# Patient Record
Sex: Female | Born: 1950 | Race: White | Hispanic: No | Marital: Married | Smoking: Never smoker
Health system: Southern US, Community
[De-identification: ages and names within clinical notes are randomized; demographics above are authoritative.]

## PROBLEM LIST (undated history)

## (undated) DIAGNOSIS — C73 Malignant neoplasm of thyroid gland: Secondary | ICD-10-CM

## (undated) HISTORY — PX: THYROIDECTOMY: SHX17

---

## 2009-12-01 ENCOUNTER — Emergency Department (HOSPITAL_COMMUNITY): Admission: EM | Admit: 2009-12-01 | Discharge: 2009-12-01 | Payer: Self-pay | Admitting: Emergency Medicine

## 2010-10-03 LAB — POCT URINALYSIS DIP (DEVICE)
Glucose, UA: 250 mg/dL — AB
Nitrite: POSITIVE — AB

## 2010-10-03 LAB — POCT I-STAT, CHEM 8
Chloride: 101 mEq/L (ref 96–112)
Glucose, Bld: 88 mg/dL (ref 70–99)
HCT: 43 % (ref 36.0–46.0)
Potassium: 3.6 mEq/L (ref 3.5–5.1)

## 2011-06-02 ENCOUNTER — Emergency Department (INDEPENDENT_AMBULATORY_CARE_PROVIDER_SITE_OTHER)
Admission: EM | Admit: 2011-06-02 | Discharge: 2011-06-02 | Disposition: A | Discharge status: Home / Self Care | Payer: BC Managed Care – PPO | Source: Home / Self Care | Attending: Family Medicine | Admitting: Family Medicine

## 2011-06-02 ENCOUNTER — Encounter: Payer: Self-pay | Admitting: *Deleted

## 2011-06-02 ENCOUNTER — Emergency Department (INDEPENDENT_AMBULATORY_CARE_PROVIDER_SITE_OTHER): Payer: BC Managed Care – PPO

## 2011-06-02 DIAGNOSIS — IMO0002 Reserved for concepts with insufficient information to code with codable children: Secondary | ICD-10-CM

## 2011-06-02 DIAGNOSIS — S6440XA Injury of digital nerve of unspecified finger, initial encounter: Secondary | ICD-10-CM

## 2011-06-02 HISTORY — DX: Malignant neoplasm of thyroid gland: C73

## 2011-06-02 MED ORDER — TETANUS-DIPHTH-ACELL PERTUSSIS 5-2.5-18.5 LF-MCG/0.5 IM SUSP
0.5000 mL | Freq: Once | INTRAMUSCULAR | Status: AC
  Administered 2011-06-02: 0.5 mL via INTRAMUSCULAR

## 2011-06-02 MED ORDER — TETANUS-DIPHTH-ACELL PERTUSSIS 5-2.5-18.5 LF-MCG/0.5 IM SUSP
INTRAMUSCULAR | Status: AC
  Filled 2011-06-02: qty 0.5

## 2011-06-02 MED ORDER — CEPHALEXIN 500 MG PO CAPS: 500.0000 mg | ORAL_CAPSULE | Freq: Four times a day (QID) | ORAL | Status: AC

## 2011-06-02 NOTE — ED Notes (Signed)
Lac    To  l  Hand   While  Cutting  Food  With a  Knife      Bleeding     Has  Now     Subsided

## 2011-06-02 NOTE — ED Notes (Signed)
Pty  States  She  Has  Had  A  Tet   Shot  Within  5  Years

## 2011-06-02 NOTE — ED Provider Notes (Signed)
History     CSN: [id number] Arrival date & time: 06/02/2011  9:48 AM   First MD Initiated Contact with Patient 06/02/11 [phone]      Chief Complaint  Patient presents with  . Laceration    (Consider location/radiation/quality/duration/timing/severity/associated sxs/prior treatment) Patient is a 60 y.o. female presenting with skin laceration. The history is provided by the patient.  Laceration  The incident occurred 1 to 2 hours ago (separating frozen biscuits with serrated knife). The laceration is located on the left hand. The laceration is 1 cm in size. The laceration mechanism was a a clean knife. The patient is experiencing no pain. Her tetanus status is out of date.    Past Medical History  Diagnosis Date  . Thyroid ca     Past Surgical History  Procedure Date  . Thyroidectomy     Family History  Problem Relation Age of Onset  . Stroke Mother   . Hypertension Father   . Thyroid disease Father   . Cancer Father     History  Substance Use Topics  . Smoking status: Never Smoker   . Smokeless tobacco: Not on file  . Alcohol Use: No    OB History    Grav Para Term Preterm Abortions TAB SAB Ect Mult Living                  Review of Systems  Constitutional: Negative.   Musculoskeletal: Negative.   Skin: Positive for wound.  Neurological: Positive for numbness.    Allergies  Review of patient's allergies indicates no known allergies.  Home Medications   Current Outpatient Rx  Name Route Sig Dispense Refill  . LEVOTHYROXINE SODIUM 50 MCG PO TABS Oral Take 50 mcg by mouth daily.      . THYROID 120 MG PO TABS Oral Take 120 mg by mouth daily.        BP 121/78  Pulse 65  Temp(Src) 98.2 F (36.8 C) (Oral)  Resp 16  SpO2 100%  Physical Exam  Constitutional: She appears well-developed and well-nourished.  Musculoskeletal: Normal range of motion.       Arms: Skin: Skin is warm and dry.    ED Course  Procedures (including critical care  time)  Labs Reviewed - No data to display No results found.   No diagnosis found.    MDM  X-rays reviewed and report per radiologist.        Barkley Bruns, MD 06/03/11 [phone]

## 2012-11-10 IMAGING — CR DG HAND COMPLETE 3+V*L*
3 series · 3 of 3 positions shown · non-contrast
Comparison: None.

CLINICAL DATA: Puncture wound.

LEFT HAND - COMPLETE 3+ VIEW

[view not recorded (1 of 3)]
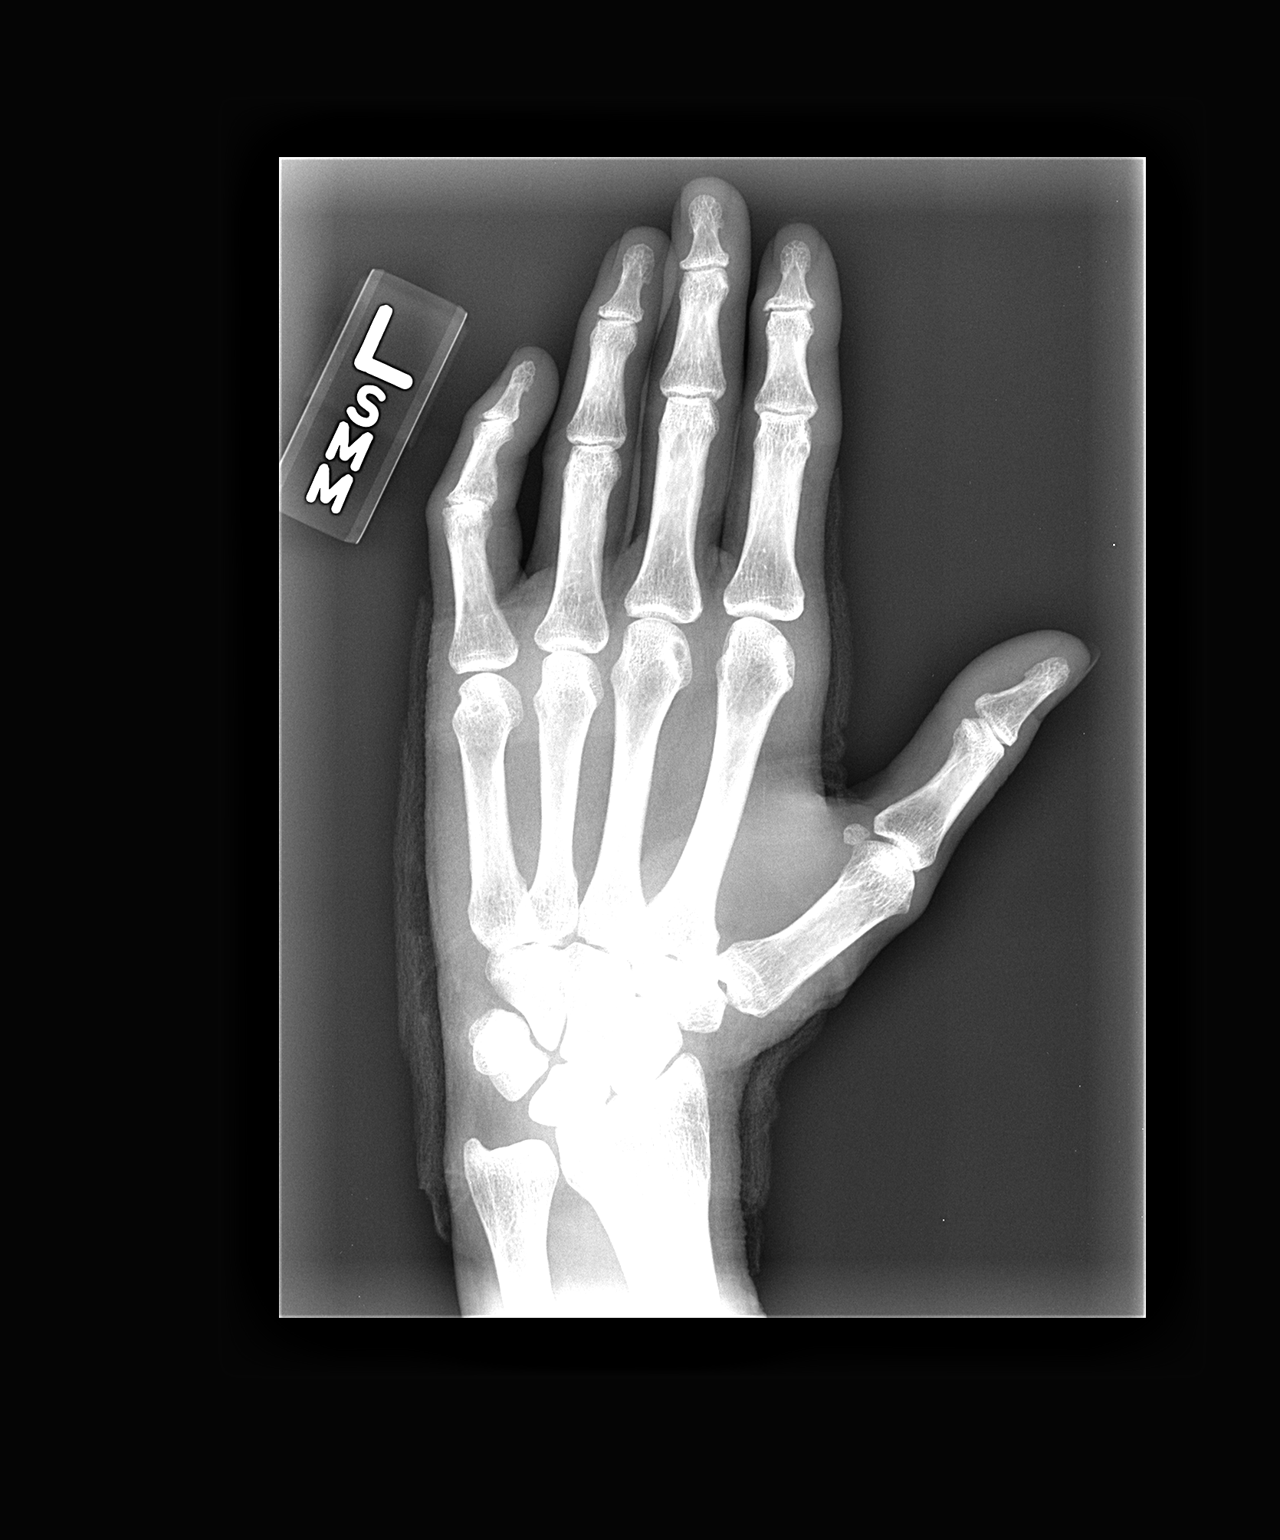

[view not recorded (2 of 3)]
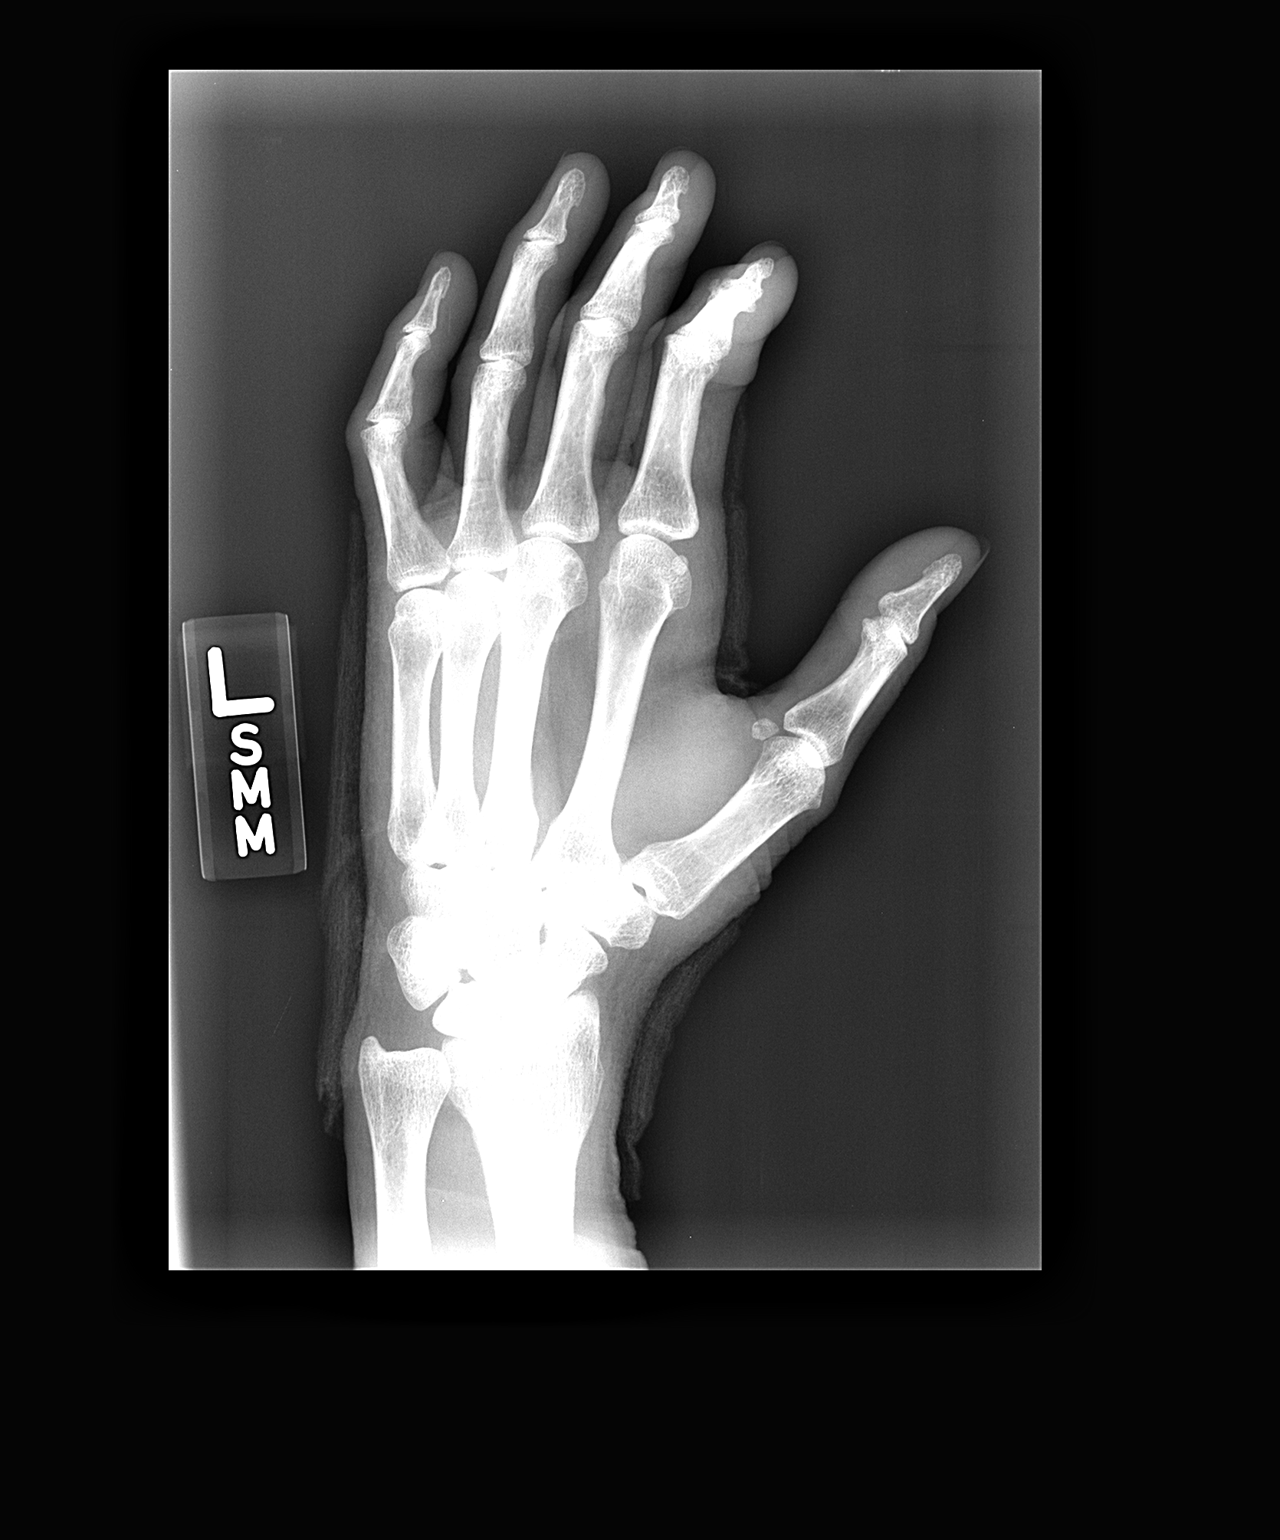

[view not recorded (3 of 3)]
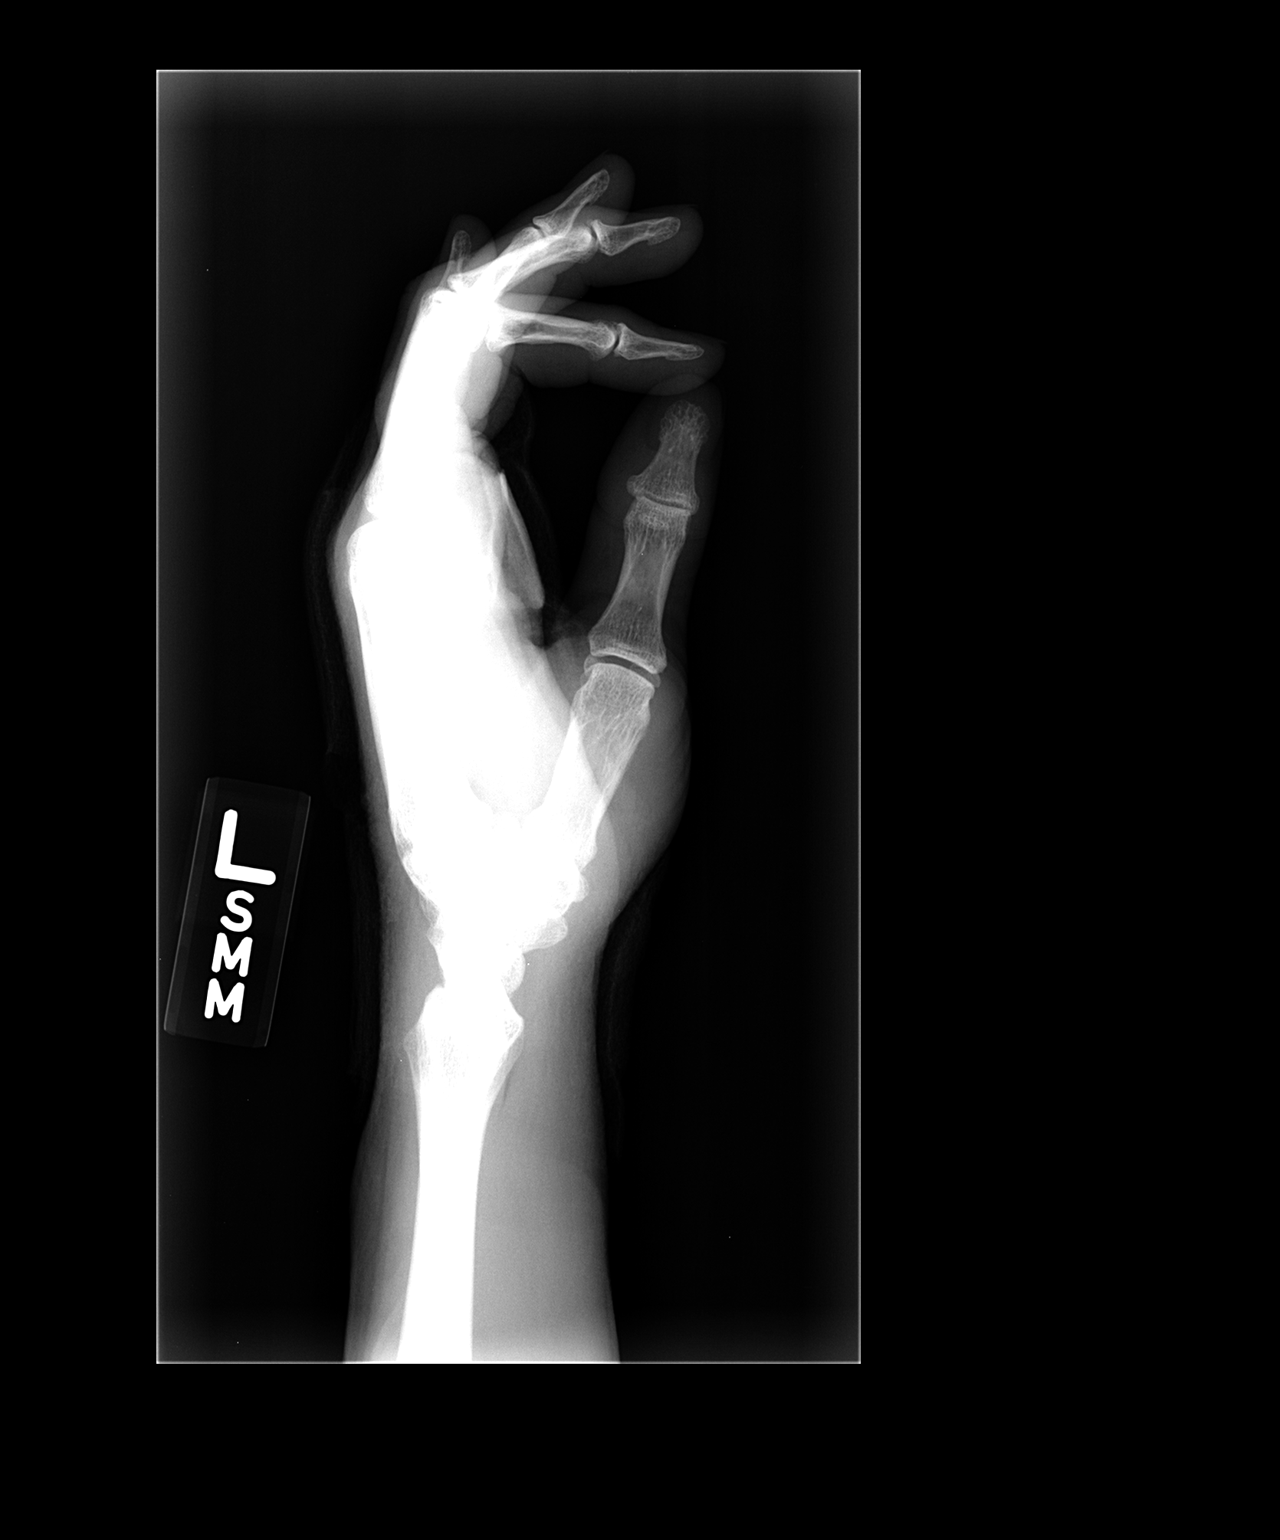

[3 of 3 positions shown; findings below may reference images not displayed]

FINDINGS: Bandaging is seen along the volar aspect of the hand on
the ulnar side.  No underlying fracture, dislocation or foreign
body.
IMPRESSION: Negative for fracture or foreign body.

## 2013-01-02 ENCOUNTER — Encounter: Payer: Self-pay | Admitting: Gastroenterology

## 2013-02-12 ENCOUNTER — Encounter: Payer: BC Managed Care – PPO | Admitting: Gastroenterology

## 2013-02-12 ENCOUNTER — Other Ambulatory Visit: Payer: BC Managed Care – PPO | Admitting: Gastroenterology

## 2013-03-03 ENCOUNTER — Ambulatory Visit (AMBULATORY_SURGERY_CENTER): Payer: BC Managed Care – PPO | Admitting: *Deleted

## 2013-03-03 VITALS — Ht 64.0 in | Wt 126.8 lb

## 2013-03-03 DIAGNOSIS — Z1211 Encounter for screening for malignant neoplasm of colon: Secondary | ICD-10-CM

## 2013-03-03 MED ORDER — MOVIPREP 100 G PO SOLR: ORAL | Status: AC

## 2013-03-03 NOTE — Progress Notes (Signed)
No allergies to eggs or soy. No problems with anesthesia.  

## 2013-03-04 ENCOUNTER — Encounter: Payer: Self-pay | Admitting: Gastroenterology

## 2013-03-11 ENCOUNTER — Other Ambulatory Visit: Payer: BC Managed Care – PPO | Admitting: Gastroenterology

## 2014-05-14 ENCOUNTER — Encounter: Payer: Self-pay | Admitting: Gastroenterology

## 2014-07-06 ENCOUNTER — Telehealth: Payer: Self-pay | Admitting: *Deleted

## 2014-07-06 NOTE — Telephone Encounter (Signed)
Pt did not call to reschedule previsit. Colonoscopy cancelled and no show letter mailed to patient.

## 2014-07-06 NOTE — Telephone Encounter (Signed)
Patient no show for previsit appointment 07/06/14. Phone call made to both mobile and home telephone numbers, number identifiers only, message left that appointment was missed and needs to be rescheduled today by 5 pm or colonoscopy scheduled 07/20/14 would also be cancelled.

## 2014-07-20 ENCOUNTER — Encounter: Payer: BC Managed Care – PPO | Admitting: Gastroenterology

## 2020-03-17 DIAGNOSIS — U071 COVID-19: Secondary | ICD-10-CM

## 2020-03-17 HISTORY — DX: COVID-19: U07.1

## 2020-04-15 ENCOUNTER — Other Ambulatory Visit: Payer: Self-pay

## 2020-04-15 ENCOUNTER — Emergency Department (HOSPITAL_COMMUNITY): Payer: 59

## 2020-04-15 ENCOUNTER — Encounter (HOSPITAL_COMMUNITY): Payer: Self-pay

## 2020-04-15 ENCOUNTER — Emergency Department (HOSPITAL_COMMUNITY)
Admission: EM | Admit: 2020-04-15 | Discharge: 2020-04-16 | Disposition: A | Discharge status: Home / Self Care | Payer: 59 | Attending: Emergency Medicine | Admitting: Emergency Medicine

## 2020-04-15 DIAGNOSIS — R519 Headache, unspecified: Secondary | ICD-10-CM | POA: Diagnosis present

## 2020-04-15 DIAGNOSIS — U071 COVID-19: Secondary | ICD-10-CM | POA: Diagnosis not present

## 2020-04-15 DIAGNOSIS — Z85850 Personal history of malignant neoplasm of thyroid: Secondary | ICD-10-CM | POA: Diagnosis not present

## 2020-04-15 DIAGNOSIS — R112 Nausea with vomiting, unspecified: Secondary | ICD-10-CM | POA: Diagnosis not present

## 2020-04-15 DIAGNOSIS — Z79890 Hormone replacement therapy (postmenopausal): Secondary | ICD-10-CM | POA: Insufficient documentation

## 2020-04-15 DIAGNOSIS — E039 Hypothyroidism, unspecified: Secondary | ICD-10-CM | POA: Insufficient documentation

## 2020-04-15 LAB — COMPREHENSIVE METABOLIC PANEL
ALT: 23 U/L (ref 0–44)
AST: 34 U/L (ref 15–41)
Albumin: 3.8 g/dL (ref 3.5–5.0)
Alkaline Phosphatase: 78 U/L (ref 38–126)
Anion gap: 11 (ref 5–15)
BUN: 12 mg/dL (ref 8–23)
CO2: 26 mmol/L (ref 22–32)
Calcium: 8.5 mg/dL — ABNORMAL LOW (ref 8.9–10.3)
Chloride: 97 mmol/L — ABNORMAL LOW (ref 98–111)
Creatinine, Ser: 0.7 mg/dL (ref 0.44–1.00)
GFR calc Af Amer: >60 mL/min (ref >60)
GFR calc non Af Amer: >60 mL/min (ref >60)
Glucose, Bld: 117 mg/dL — ABNORMAL HIGH (ref 70–99)
Potassium: 4.2 mmol/L (ref 3.5–5.1)
Sodium: 134 mmol/L — ABNORMAL LOW (ref 135–145)
Total Bilirubin: 0.3 mg/dL (ref 0.3–1.2)
Total Protein: 7.3 g/dL (ref 6.5–8.1)

## 2020-04-15 LAB — CBC
HCT: 46.2 % — ABNORMAL HIGH (ref 36.0–46.0)
Hemoglobin: 15.4 g/dL — ABNORMAL HIGH (ref 12.0–15.0)
MCH: 26.8 pg (ref 26.0–34.0)
MCHC: 33.3 g/dL (ref 30.0–36.0)
MCV: 80.5 fL (ref 80.0–100.0)
Platelets: 182 10*3/uL (ref 150–400)
RBC: 5.74 MIL/uL — ABNORMAL HIGH (ref 3.87–5.11)
RDW: 12.6 % (ref 11.5–15.5)
WBC: 4.5 10*3/uL (ref 4.0–10.5)
nRBC: 0 % (ref 0.0–0.2)

## 2020-04-15 MED ORDER — ACETAMINOPHEN 325 MG PO TABS
650.0000 mg | ORAL_TABLET | Freq: Once | ORAL | Status: AC
  Administered 2020-04-16: 650 mg via ORAL
  Filled 2020-04-15: qty 2

## 2020-04-15 MED ORDER — SODIUM CHLORIDE 0.9 % IV BOLUS
500.0000 mL | Freq: Once | INTRAVENOUS | Status: AC
  Administered 2020-04-16: 500 mL via INTRAVENOUS

## 2020-04-15 NOTE — ED Triage Notes (Signed)
Pt was dx with covid on Monday. Pt stated that last night she began having N/V. Pt prescribed phenergan, states it took the edge off nausea but not completely gone. Pt worried that she may be dehydrated. PCP recommended that the pt goes to ED.

## 2020-04-15 NOTE — ED Provider Notes (Signed)
[facility] DEPT Provider Note   CSN: [id number] Arrival date & time: 04/15/20  1939     History Chief Complaint  Patient presents with  . Nausea    Gloria Diaz is a 69 y.o. female with known COVID-19 infection diagnosed on 04/12/2020 (at [facility]), which was her first day of symptoms.  She reports severe myalgias, headache, nausea and nonbloody nonbilious vomiting. She denies fever, chills,  but endorses sweats. She denies cough, shortness of breath, chest pain, sore throat, congestion, diarrhea.  She was seen by telemedicine visit with her new PCP yesterday, due to worsening nausea with multiple episodes of emesis.  This provider prescribed her colchicine, Tamiflu, fluvoxamine, and Phenergan.  Patient reports that she took the first dose of each of these medicines last night and shortly thereafter vomited them back up.  She has not taken any more medications, except for Phenergan.  Her most recent dose of Phenergan was at 5 PM; she states that her nausea is mild at this point.  She has not vomited today, but has had several episodes of dry heaving.  She says she has not had anything to eat or drink today.   I have personally reviewed the patient's medical record.  Medical history significant for hypothyroidism on multiple medications (she states she feels best when on all 3 thyroid medications), and chronic sinusitis which is followed by otolaryngology.  Patient does not have a primary care physician.  She recently finished a course of levofloxacin from her ENT, for sinusitis.    She has not been vaccinated against COVID-19.  Because she was concerned that it would "get inside and change parts of her body", and felt that she was healthy enough to withstand COVID-19 infection.  HPI     Past Medical History:  Diagnosis Date  . Thyroid ca [facility])     There are no problems to display for this patient.   Past Surgical History:  Procedure Laterality  Date  . THYROIDECTOMY       OB History   No obstetric history on file.     Family History  Problem Relation Age of Onset  . Stroke Mother   . Hypertension Father   . Thyroid disease Father   . Cancer Father   . Colon cancer Neg Hx     Social History   Tobacco Use  . Smoking status: Never Smoker  . Smokeless tobacco: Never Used  Substance Use Topics  . Alcohol use: Yes    Alcohol/week: 5.0 standard drinks    Types: 5 Glasses of wine per week  . Drug use: No    Home Medications Prior to Admission medications   Medication Sig Start Date End Date Taking? Authorizing Provider  levothyroxine (SYNTHROID, LEVOTHROID) 50 MCG tablet Take 50 mcg by mouth daily.      [provider]  liothyronine (CYTOMEL) 5 MCG tablet Take 5 mcg by mouth daily.    [provider]  MOVIPREP 100 G SOLR moviprep as directed. No substitutions 03/03/13   Milus Banister, MD  thyroid (ARMOUR) 120 MG tablet Take 120 mg by mouth daily.      [provider]    Allergies    Patient has no known allergies.  Review of Systems   Review of Systems  Constitutional: Positive for appetite change, chills, diaphoresis and fatigue. Negative for fever.  HENT: Negative.   Eyes: Negative.   Respiratory: Negative for cough, chest tightness and shortness of breath.  Cardiovascular: Negative for chest pain, palpitations and leg swelling.  Gastrointestinal: Positive for nausea and vomiting. Negative for abdominal pain and diarrhea.  Genitourinary: Negative for dysuria.  Musculoskeletal: Positive for myalgias. Negative for neck pain and neck stiffness.  Neurological: Positive for headaches. Negative for syncope, weakness and light-headedness.  Hematological: Positive for adenopathy.  Psychiatric/Behavioral: Negative.     Physical Exam Updated Vital Signs BP 111/75   Pulse 68   Temp 98.8 F (37.1 C) (Oral)   Resp 12   Ht 5\' 4"  (1.626 m)   Wt 57.6 kg   SpO2 96%   BMI 21.80 kg/m     Physical Exam Vitals and nursing note reviewed.  HENT:     Head: Normocephalic and atraumatic.     Nose:     Right Turbinates: Enlarged.     Left Turbinates: Enlarged.     Mouth/Throat:     Mouth: Mucous membranes are dry.     Pharynx: Oropharynx is clear. Uvula midline. No pharyngeal swelling, oropharyngeal exudate or posterior oropharyngeal erythema.  Eyes:     General:        Right eye: No discharge.        Left eye: No discharge.     Extraocular Movements: Extraocular movements intact.     Conjunctiva/sclera: Conjunctivae normal.     Pupils: Pupils are equal, round, and reactive to light.  Cardiovascular:     Rate and Rhythm: Normal rate and regular rhythm.     Pulses: Normal pulses.     Heart sounds: Normal heart sounds. No murmur heard.   Pulmonary:     Effort: Pulmonary effort is normal. No respiratory distress.     Breath sounds: Examination of the right-lower field reveals rhonchi. Rhonchi present. No wheezing.  Abdominal:     General: Bowel sounds are normal. There is no distension.     Palpations: Abdomen is soft.     Tenderness: There is no abdominal tenderness.  Musculoskeletal:        General: No tenderness or deformity.     Cervical back: Normal range of motion and neck supple. No pain with movement.     Right lower leg: No edema.     Left lower leg: No edema.  Lymphadenopathy:     Cervical: Cervical adenopathy present.     Right cervical: Superficial cervical adenopathy present.     Left cervical: Superficial cervical adenopathy present.  Skin:    General: Skin is warm and dry.     Capillary Refill: Capillary refill takes less than 2 seconds.  Neurological:     General: No focal deficit present.     Mental Status: She is alert and oriented to person, place, and time.  Psychiatric:        Mood and Affect: Mood normal.     ED Results / Procedures / Treatments   Labs (all labs ordered are listed, but only abnormal results are displayed) Labs  Reviewed  COMPREHENSIVE METABOLIC PANEL - Abnormal; Notable for the following components:      Result Value   Sodium 134 (*)    Chloride 97 (*)    Glucose, Bld 117 (*)    Calcium 8.5 (*)    All other components within normal limits  CBC - Abnormal; Notable for the following components:   RBC 5.74 (*)    Hemoglobin 15.4 (*)    HCT 46.2 (*)    All other components within normal limits    EKG None  Radiology DG  Chest Portable 1 View  Result Date: 04/15/2020 CLINICAL DATA:  [facility] COVID EXAM: PORTABLE CHEST 1 VIEW COMPARISON:  None. FINDINGS: No focal airspace disease or effusion. Borderline cardiomegaly. No pneumothorax. IMPRESSION: No active disease. Electronically Signed   By: Donavan Foil M.D.   On: 04/15/2020 23:54    Procedures Procedures (including critical care time)  Medications Ordered in ED Medications  sodium chloride 0.9 % bolus 500 mL (500 mLs Intravenous New Bag/Given 04/16/20 0010)  acetaminophen (TYLENOL) tablet 650 mg (650 mg Oral Given 04/16/20 0013)  promethazine (PHENERGAN) tablet 25 mg (25 mg Oral Given 04/16/20 0042)    ED Course  I have reviewed the triage vital signs and the nursing notes.  Pertinent labs & imaging results that were available during my care of the patient were reviewed by me and considered in my medical decision making (see chart for details).    MDM Rules/Calculators/A&P                         69 year old female with history of hypothyroidism diagnosed with COVID-19 on 04/12/2020.  Presents with worsening nausea, feeling dehydrated.   Vital signs normal on intake.  CBC hemoglobin 15.4.  CMP mild hyponatremia 134; normal kidney function.  Chest x-ray negative for acute cardiopulmonary disease.  Given borderline dry oral mucous membranes and borderline tachycardia of heart rate into 90s on physical exam, as well as patient feeling weak without oral intake today, will administer 500 cc normal saline bolus.  Antiemetic offered,  however patient declined stating she feels okay for now. Tylenol offered for myalgia.  Given patient's age of > 96 years old she does qualify for monoclonal antibody infusion outpatient.  I have messaged [facility], and communicated her case to them.  Patient now requesting nausea medication, promethazine ordered.  Given negative chest x-ray, stable vital signs, reassuring laboratory studies I do not feel any further work-up is necessary in the emergency department at this time.  Assuming patient's vital signs remain stable, she may be discharged after completion of her fluid bolus.  I have discussed the laboratory and imaging results with the patient.  I have instructed her to discontinue taking the colchicine, Tamiflu, fluvoxamine that were prescribed to her; there is insufficient scientific data to support there is usage for treatment of COVID-19 infection.  She may continue to take the promethazine as needed for nausea. I encouraged her to establish care with a primary care provider who can see her in person if necessary, as opposed to telemedicine only visits.   She voiced understanding of the treatment plan and each of her questions were answered to her expressed satisfaction.  She is amenable to discharge after completion of her fluids.  Strict return precautions were given at shift change.  Care of this patient was signed out to oncoming PA Quincy Carnes.  Gloria Diaz was evaluated in Emergency Department on 04/16/2020 for the symptoms described in the history of present illness. She was evaluated in the context of the global COVID-19 pandemic, which necessitated consideration that the patient might be at risk for infection with the SARS-CoV-2 virus that causes COVID-19. Institutional protocols and algorithms that pertain to the evaluation of patients at risk for COVID-19 are in a state of rapid change based on information released by regulatory bodies including the CDC and federal and state  organizations. These policies and algorithms were followed during the patient's care in the ED.  Final Clinical Impression(s) / ED Diagnoses Final  diagnoses:  [id number]    Rx / DC Orders ED Discharge Orders    None       Aura Dials 04/16/20 0054    Truddie Hidden, MD 04/16/20 [phone]

## 2020-04-15 NOTE — ED Notes (Signed)
1 blue top and 2 golds collected also

## 2020-04-16 ENCOUNTER — Other Ambulatory Visit: Payer: Self-pay | Admitting: Nurse Practitioner

## 2020-04-16 ENCOUNTER — Telehealth: Payer: Self-pay | Admitting: Nurse Practitioner

## 2020-04-16 ENCOUNTER — Encounter: Payer: Self-pay | Admitting: Nurse Practitioner

## 2020-04-16 DIAGNOSIS — U071 COVID-19: Secondary | ICD-10-CM

## 2020-04-16 MED ORDER — PROMETHAZINE HCL 25 MG PO TABS
25.0000 mg | ORAL_TABLET | Freq: Once | ORAL | Status: AC
  Administered 2020-04-16: 25 mg via ORAL
  Filled 2020-04-16: qty 1

## 2020-04-16 NOTE — Telephone Encounter (Signed)
I connected by phone with Gloria Diaz on 04/16/2020 at 2:00 PM to discuss the potential use of a new treatment for mild to moderate COVID-19 viral infection in non-hospitalized patients.  This patient is a 69 y.o. female that meets the FDA criteria for Emergency Use Authorization of COVID monoclonal antibody casirivimab/imdevimab.  Has a (+) direct SARS-CoV-2 viral test result  Has mild or moderate COVID-19   Is NOT hospitalized due to COVID-19  Is within 10 days of symptom onset  Has at least one of the high risk factor(s) for progression to severe COVID-19 and/or hospitalization as defined in EUA.  Specific high risk criteria : Older age (>/= 67 yo)   I have spoken and communicated the following to the patient or parent/caregiver regarding COVID monoclonal antibody treatment:  1. FDA has authorized the emergency use for the treatment of mild to moderate COVID-19 in adults and pediatric patients with positive results of direct SARS-CoV-2 viral testing who are 67 years of age and older weighing at least 40 kg, and who are at high risk for progressing to severe COVID-19 and/or hospitalization.  2. The significant known and potential risks and benefits of COVID monoclonal antibody, and the extent to which such potential risks and benefits are unknown.  3. Information on available alternative treatments and the risks and benefits of those alternatives, including clinical trials.  4. Patients treated with COVID monoclonal antibody should continue to self-isolate and use infection control measures (e.g., wear mask, isolate, social distance, avoid sharing personal items, clean and disinfect "high touch" surfaces, and frequent handwashing) according to CDC guidelines.   5. The patient or parent/caregiver has the option to accept or refuse COVID monoclonal antibody treatment.  After reviewing this information with the patient, the patient has agreed to receive one of the available covid 19  monoclonal antibodies and will be provided an appropriate fact sheet prior to infusion.  Murray Hodgkins, NP 04/16/2020 2:00 PM

## 2020-04-16 NOTE — Discharge Instructions (Addendum)
You have been seen in the emergency department today for further evaluation of your known COVID-19 infection.  You were administered IV fluids, Tylenol, and nausea medicine while in the emergency department.  Additionally you have qualified for monoclonal antibody therapy.  I have given your information to the [facility] monoclonal antibody team; you can expect a call from them sometime tomorrow. You may continue to utilize Tylenol and ibuprofen at home for management of your muscle pain.  Additionally you can continue to utilize the promethazine previously prescribed to you for nausea.  Please discontinue taking the colchicine, Tamiflu, fluvoxamine that were previously prescribed to you.  There is insufficient scientific data to support their usage for the treatment of COVID-19 infection.  Additionally I would recommend that you establish care with a primary care provider was able to see you in person if necessary, rather than televisit alone.  Please return to the emergency department immediately should you develop any new chest pain, shortness of breath, intractable nausea and vomiting, or other new and severe symptoms.

## 2020-04-16 NOTE — Progress Notes (Signed)
I connected by phone with Gloria Diaz on 04/16/2020 at 2:00 PM to discuss the potential use of a new treatment for mild to moderate COVID-19 viral infection in non-hospitalized patients.  This patient is a 69 y.o. female that meets the FDA criteria for Emergency Use Authorization of COVID monoclonal antibody casirivimab/imdevimab.  Has a (+) direct SARS-CoV-2 viral test result  Has mild or moderate COVID-19   Is NOT hospitalized due to COVID-19  Is within 10 days of symptom onset  Has at least one of the high risk factor(s) for progression to severe COVID-19 and/or hospitalization as defined in EUA.  Specific high risk criteria : Older age (>/= 68 yo)   I have spoken and communicated the following to the patient or parent/caregiver regarding COVID monoclonal antibody treatment:  1. FDA has authorized the emergency use for the treatment of mild to moderate COVID-19 in adults and pediatric patients with positive results of direct SARS-CoV-2 viral testing who are 47 years of age and older weighing at least 40 kg, and who are at high risk for progressing to severe COVID-19 and/or hospitalization.  2. The significant known and potential risks and benefits of COVID monoclonal antibody, and the extent to which such potential risks and benefits are unknown.  3. Information on available alternative treatments and the risks and benefits of those alternatives, including clinical trials.  4. Patients treated with COVID monoclonal antibody should continue to self-isolate and use infection control measures (e.g., wear mask, isolate, social distance, avoid sharing personal items, clean and disinfect "high touch" surfaces, and frequent handwashing) according to CDC guidelines.   5. The patient or parent/caregiver has the option to accept or refuse COVID monoclonal antibody treatment.  After reviewing this information with the patient, the patient has agreed to receive one of the available covid 19  monoclonal antibodies and will be provided an appropriate fact sheet prior to infusion.  Murray Hodgkins, NP 04/16/2020 2:00 PM

## 2020-04-17 ENCOUNTER — Ambulatory Visit (HOSPITAL_COMMUNITY)
Admission: RE | Admit: 2020-04-17 | Discharge: 2020-04-17 | Disposition: A | Discharge status: Home / Self Care | Payer: 59 | Source: Ambulatory Visit | Attending: Pulmonary Disease | Admitting: Pulmonary Disease

## 2020-04-17 DIAGNOSIS — U071 COVID-19: Secondary | ICD-10-CM | POA: Diagnosis present

## 2020-04-17 MED ORDER — SODIUM CHLORIDE 0.9 % IV SOLN
1200.0000 mg | Freq: Once | INTRAVENOUS | Status: AC
  Administered 2020-04-17: 1200 mg via INTRAVENOUS

## 2020-04-17 MED ORDER — DIPHENHYDRAMINE HCL 50 MG/ML IJ SOLN: 50.0000 mg | Freq: Once | INTRAMUSCULAR | Status: DC | PRN

## 2020-04-17 MED ORDER — EPINEPHRINE 0.3 MG/0.3ML IJ SOAJ: 0.3000 mg | Freq: Once | INTRAMUSCULAR | Status: DC | PRN

## 2020-04-17 MED ORDER — FAMOTIDINE IN NACL 20-0.9 MG/50ML-% IV SOLN: 20.0000 mg | Freq: Once | INTRAVENOUS | Status: DC | PRN

## 2020-04-17 MED ORDER — ALBUTEROL SULFATE HFA 108 (90 BASE) MCG/ACT IN AERS: 2.0000 | INHALATION_SPRAY | Freq: Once | RESPIRATORY_TRACT | Status: DC | PRN

## 2020-04-17 MED ORDER — METHYLPREDNISOLONE SODIUM SUCC 125 MG IJ SOLR: 125.0000 mg | Freq: Once | INTRAMUSCULAR | Status: DC | PRN

## 2020-04-17 MED ORDER — SODIUM CHLORIDE 0.9 % IV SOLN: INTRAVENOUS | Status: DC | PRN

## 2020-04-17 NOTE — Progress Notes (Signed)
  Diagnosis: COVID-19  Physician:  Procedure: Covid Infusion Clinic Med: casirivimab\imdevimab infusion - Provided patient with casirivimab\imdevimab fact sheet for patients, parents and caregivers prior to infusion.  Complications: No immediate complications noted.  Discharge: Discharged home   Carilyn Goodpasture 04/17/2020

## 2020-04-17 NOTE — Discharge Instructions (Signed)

## 2020-04-19 ENCOUNTER — Other Ambulatory Visit (HOSPITAL_COMMUNITY): Payer: Self-pay

## 2021-09-24 IMAGING — DX DG CHEST 1V PORT
1 series · 1 of 1 positions shown · non-contrast
Comparison: None.

CLINICAL DATA: Rhonchi COVID

EXAM:
PORTABLE CHEST 1 VIEW

[chest ap]
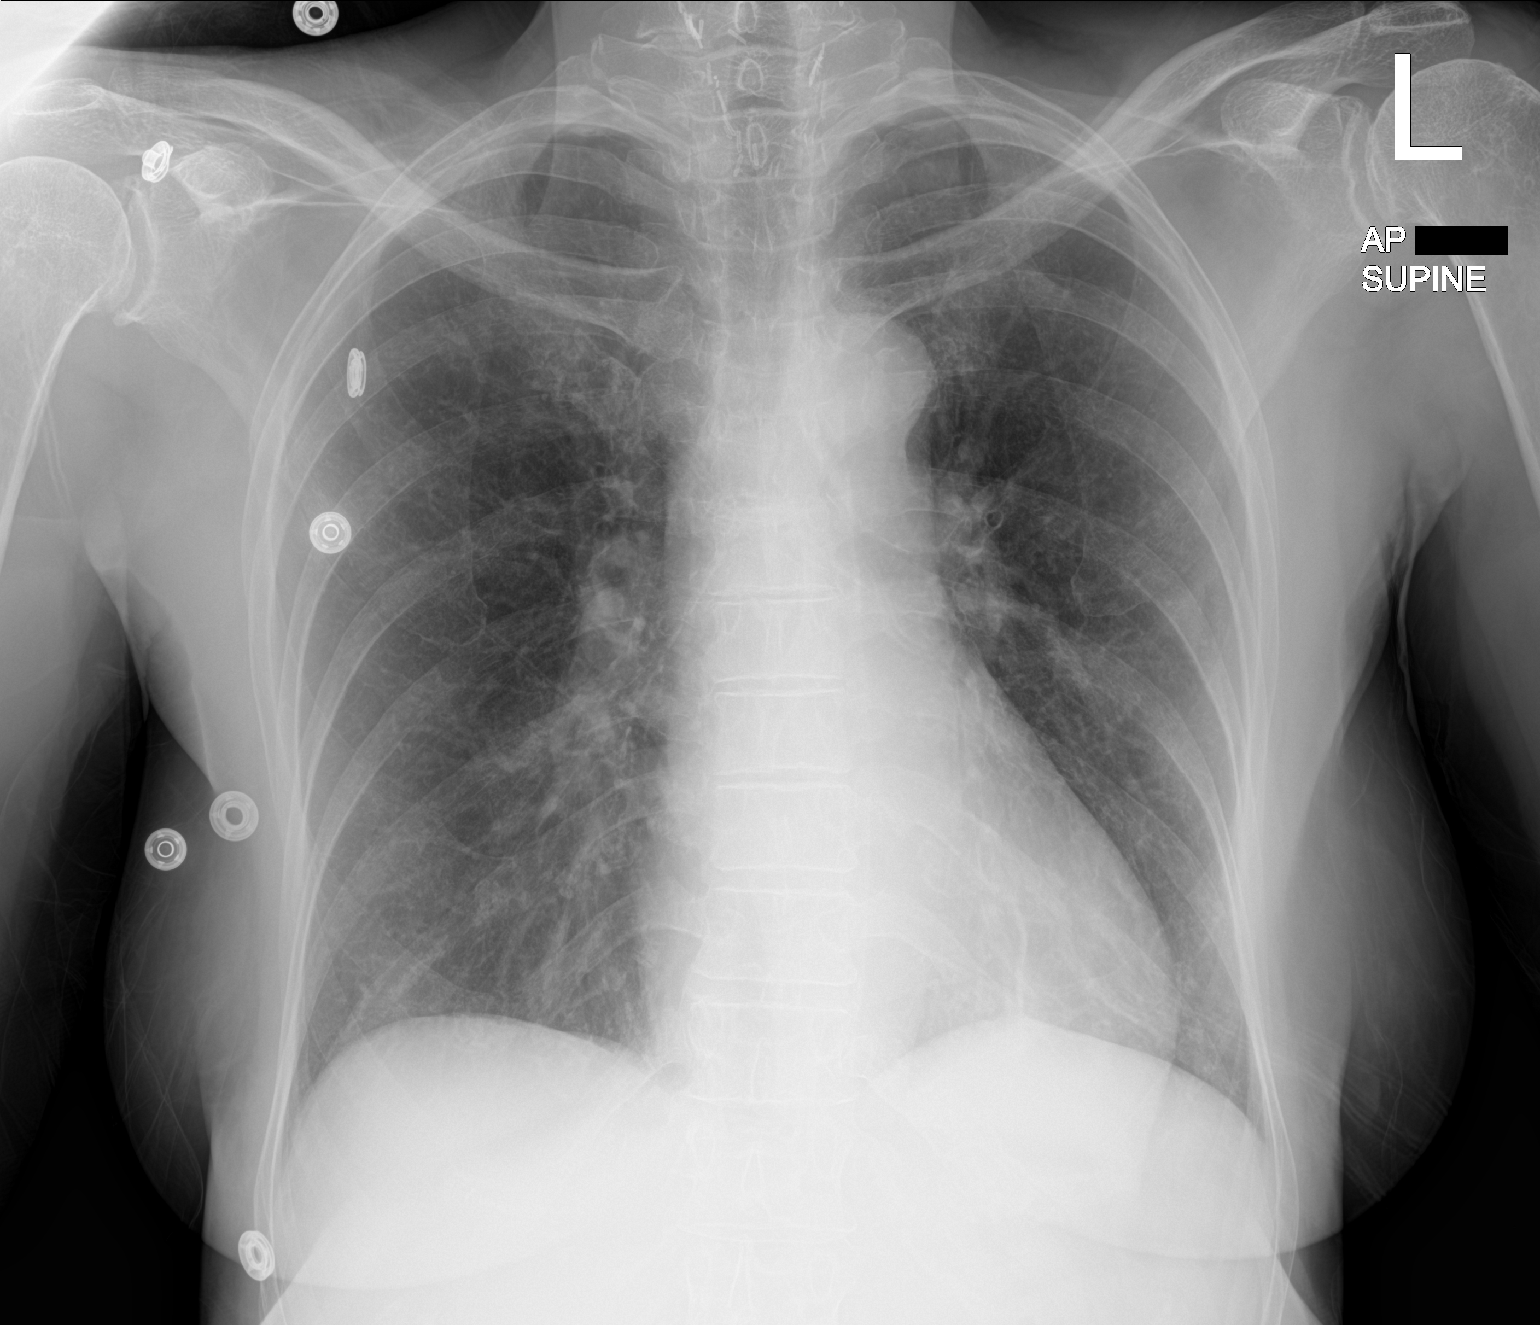

[1 of 1 positions shown; findings below may reference images not displayed]

FINDINGS: No focal airspace disease or effusion. Borderline cardiomegaly. No
pneumothorax.
IMPRESSION: No active disease.

## 2022-08-31 ENCOUNTER — Other Ambulatory Visit: Payer: Self-pay | Admitting: Obstetrics and Gynecology

## 2022-08-31 DIAGNOSIS — R928 Other abnormal and inconclusive findings on diagnostic imaging of breast: Secondary | ICD-10-CM

## 2022-09-11 ENCOUNTER — Other Ambulatory Visit: Payer: Self-pay | Admitting: Obstetrics and Gynecology

## 2022-09-11 ENCOUNTER — Ambulatory Visit
Admission: RE | Admit: 2022-09-11 | Discharge: 2022-09-11 | Disposition: A | Discharge status: Home / Self Care | Payer: 59 | Source: Ambulatory Visit | Attending: Obstetrics and Gynecology | Admitting: Obstetrics and Gynecology

## 2022-09-11 ENCOUNTER — Ambulatory Visit
Admission: RE | Admit: 2022-09-11 | Discharge: 2022-09-11 | Disposition: A | Discharge status: Home / Self Care | Payer: Medicare Other | Source: Ambulatory Visit | Attending: Obstetrics and Gynecology | Admitting: Obstetrics and Gynecology

## 2022-09-11 DIAGNOSIS — N6489 Other specified disorders of breast: Secondary | ICD-10-CM

## 2022-09-11 DIAGNOSIS — R928 Other abnormal and inconclusive findings on diagnostic imaging of breast: Secondary | ICD-10-CM

## 2023-03-26 ENCOUNTER — Other Ambulatory Visit: Payer: Medicare Other

## 2023-04-10 ENCOUNTER — Ambulatory Visit
Admission: RE | Admit: 2023-04-10 | Discharge: 2023-04-10 | Disposition: A | Discharge status: Home / Self Care | Payer: Medicare Other | Source: Ambulatory Visit | Attending: Obstetrics and Gynecology | Admitting: Obstetrics and Gynecology

## 2023-04-10 ENCOUNTER — Ambulatory Visit: Admission: RE | Admit: 2023-04-10 | Payer: Medicare Other | Source: Ambulatory Visit

## 2023-04-10 ENCOUNTER — Other Ambulatory Visit: Payer: Self-pay | Admitting: Obstetrics and Gynecology

## 2023-04-10 DIAGNOSIS — N6489 Other specified disorders of breast: Secondary | ICD-10-CM
